# Patient Record
Sex: Female | Born: 1950 | Race: White | Hispanic: No | Marital: Married
Health system: Southern US, Community
[De-identification: ages and names within clinical notes are randomized; demographics above are authoritative.]

---

## 2013-03-26 ENCOUNTER — Ambulatory Visit: Payer: Self-pay | Admitting: Internal Medicine

## 2013-04-06 ENCOUNTER — Ambulatory Visit: Payer: Self-pay | Admitting: Internal Medicine

## 2013-04-06 HISTORY — PX: BREAST BIOPSY: SHX20

## 2013-04-09 LAB — PATHOLOGY REPORT

## 2013-05-12 ENCOUNTER — Ambulatory Visit: Payer: Self-pay | Admitting: Surgery

## 2013-05-12 HISTORY — PX: BREAST EXCISIONAL BIOPSY: SUR124

## 2013-05-13 LAB — PATHOLOGY REPORT

## 2014-07-10 NOTE — Op Note (Signed)
PATIENT NAME:  Anna Terrell, Anna Terrell MR#:  [id number] DATE OF BIRTH:  Dec 25, 1950  DATE OF PROCEDURE:  05/12/2013  PREOPERATIVE DIAGNOSIS: Atypical ductal hyperplasia of the left breast.   POSTOPERATIVE DIAGNOSIS: Atypical ductal hyperplasia of the left breast.   PROCEDURE: Excision of left breast mass.   SURGEON: Renda RollsWilton Lasalle Abee, M.D.   ANESTHESIA: General.   INDICATIONS: This 64 year old female recently had mammogram depicting a small cluster of microcalcifications. Stereotactic needle biopsy demonstrated atypical ductal hyperplasia and excision of a larger portion of tissue was recommended for further evaluation and treatment.  DESCRIPTION OF PROCEDURE: The patient was placed on the operating table in the supine position under general anesthesia. The left arm was placed on a lateral arm rest. The dressing was removed from the lateral aspect of the left breast exposing the Kopans wire, which entered the breast peripheral aspect at the 3-o'clock position. The wire was cut 2 cm from the skin. The breast was prepared with ChloraPrep and draped in a sterile manner. Mammogram images were viewed, seeing the location of the biopsy marker at the 3-o'clock position in the proximity of the Kopans wire. Next, a curvilinear incision was made from approximately 2-o'clock to 4-o'clock position of the outer aspect of the left breast and dissection was carried down to encounter the wire. Next, a portion of tissue surrounding the thick portion of the wire was removed, which was approximately 1.5 x 1.5 x 1.5 cm in dimension and this was submitted for specimen mammogram. The wound was inspected. A number of small bleeding points were cauterized during the course of the procedure. Hemostasis was subsequently intact. The tissues surrounding cautery artifact were infiltrated with 0.5% Sensorcaine with epinephrine and also subcutaneous tissues were infiltrated. The subcutaneous tissues were approximated with interrupted 4-0 chromic.    The radiologist did call back to report that the biopsy marker was seen in the specimen and tissues were sent on for routine pathology. The skin was closed with running 4-0 Monocryl subcuticular suture and Dermabond. The patient tolerated the procedure satisfactorily and was then prepared for transfer to the recovery room.    ____________________________ Shela CommonsJ. Renda RollsWilton Maui Britten, MD jws:aw D: 05/12/2013 11:25:24 ET T: 05/12/2013 11:45:01 ET JOB#: [id number]  cc: Adella HareJ. Wilton Brondon Wann, MD, <Dictator> Adella HareWILTON J Janea Schwenn MD ELECTRONICALLY SIGNED 05/13/2013 16:16

## 2014-07-20 ENCOUNTER — Other Ambulatory Visit: Payer: Self-pay | Admitting: Internal Medicine

## 2014-07-20 DIAGNOSIS — Z1231 Encounter for screening mammogram for malignant neoplasm of breast: Secondary | ICD-10-CM

## 2014-08-05 ENCOUNTER — Ambulatory Visit
Admission: RE | Admit: 2014-08-05 | Discharge: 2014-08-05 | Disposition: A | Discharge status: Home / Self Care | Payer: BC Managed Care – PPO | Source: Ambulatory Visit | Attending: Internal Medicine | Admitting: Internal Medicine

## 2014-08-05 DIAGNOSIS — R921 Mammographic calcification found on diagnostic imaging of breast: Secondary | ICD-10-CM | POA: Insufficient documentation

## 2014-08-05 DIAGNOSIS — Z1231 Encounter for screening mammogram for malignant neoplasm of breast: Secondary | ICD-10-CM | POA: Insufficient documentation

## 2015-08-10 ENCOUNTER — Other Ambulatory Visit: Payer: Self-pay | Admitting: Internal Medicine

## 2015-08-22 ENCOUNTER — Other Ambulatory Visit: Payer: Self-pay | Admitting: Internal Medicine

## 2015-08-22 DIAGNOSIS — Z1231 Encounter for screening mammogram for malignant neoplasm of breast: Secondary | ICD-10-CM

## 2015-09-06 ENCOUNTER — Ambulatory Visit
Admission: RE | Admit: 2015-09-06 | Discharge: 2015-09-06 | Disposition: A | Discharge status: Home / Self Care | Payer: Medicare Other | Source: Ambulatory Visit | Attending: Internal Medicine | Admitting: Internal Medicine

## 2015-09-06 ENCOUNTER — Other Ambulatory Visit: Payer: Self-pay | Admitting: Internal Medicine

## 2015-09-06 DIAGNOSIS — Z1231 Encounter for screening mammogram for malignant neoplasm of breast: Secondary | ICD-10-CM | POA: Diagnosis present

## 2015-11-21 IMAGING — MG MM BREAST BX W LOC DEV 1ST LESION IMAGE BX SPEC STEREO GUIDE
2 series · 3 of 3 positions shown · non-contrast
Comparison: Previous exams.

ADDENDUM:
The final pathological diagnosis is atypical ductal hyperplasia
associated with microcalcifications and columnar cell change
associated with calcifications. Due to an association of atypical
ductal hyperplasia with ductal carcinoma in situ on surgical
excision, mammographic needle localization and surgical excision of
the biopsy site in the lateral left breast is recommended. I have
not been able to reach the patient to discuss the findings and
recommendation with her at this time.

I discussed the final pathological diagnosis and recommendations
with the patient by telephone on 04/11/2013. Her questions were
answered. She reported a small amount of pain at the biopsy site
with no bruising or palpable hematoma. She will follow-up with Dr.
Guztavo.
CLINICAL DATA: Suspicious left breast calcifications.
EXAM:
LEFT STEREOTACTIC CORE NEEDLE BIOPSY

[L CC · left · 2 of 2 slices shown (1 of 2)]
[im 1/2]
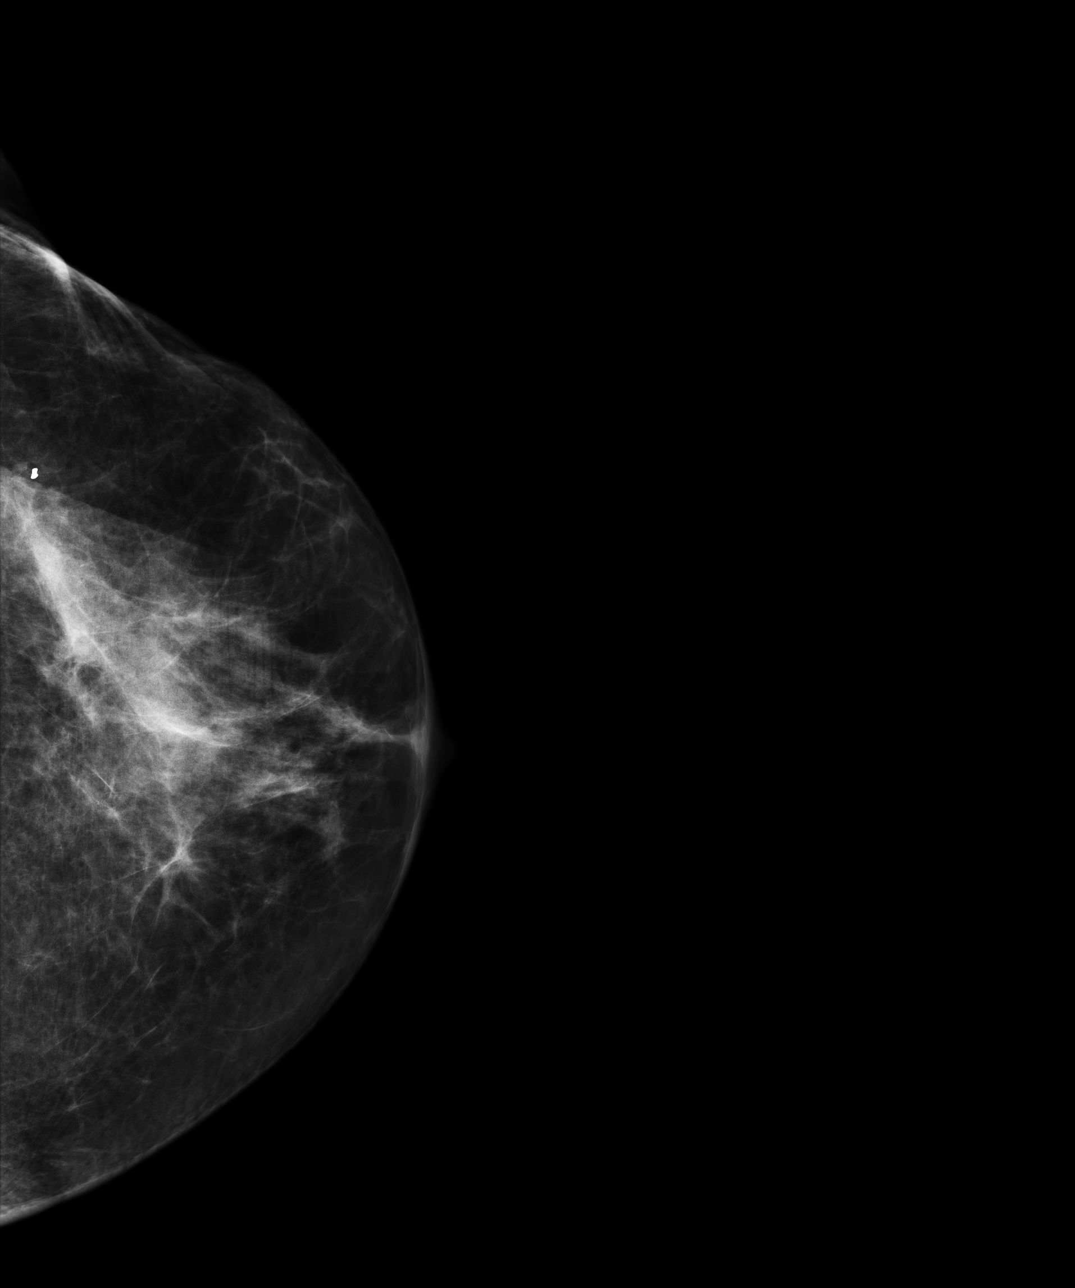
[im 2/2]
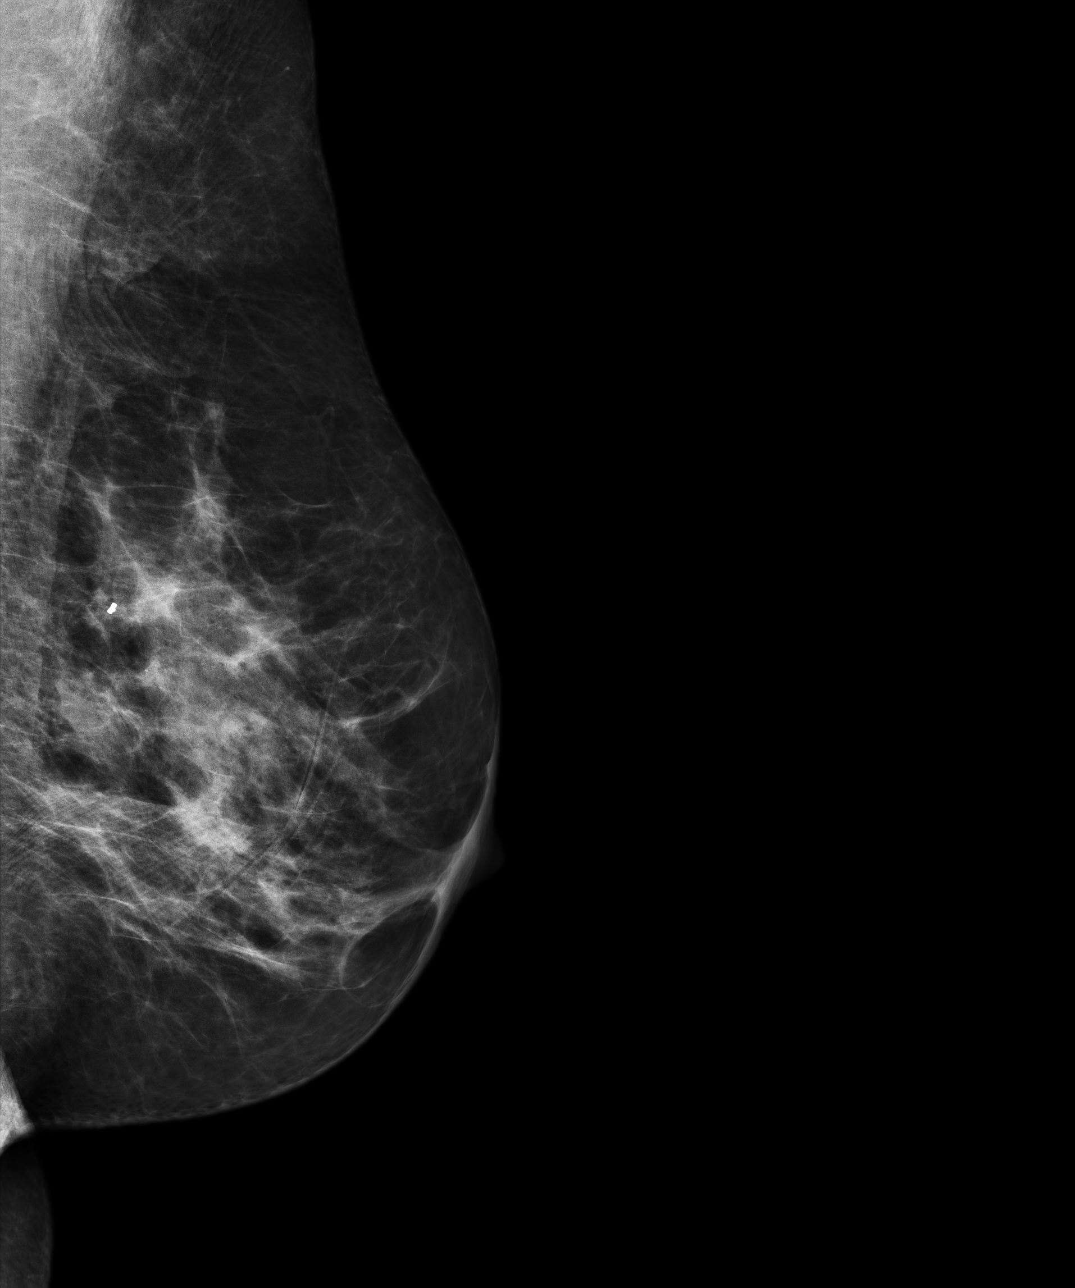

[L CC (2 of 2)]
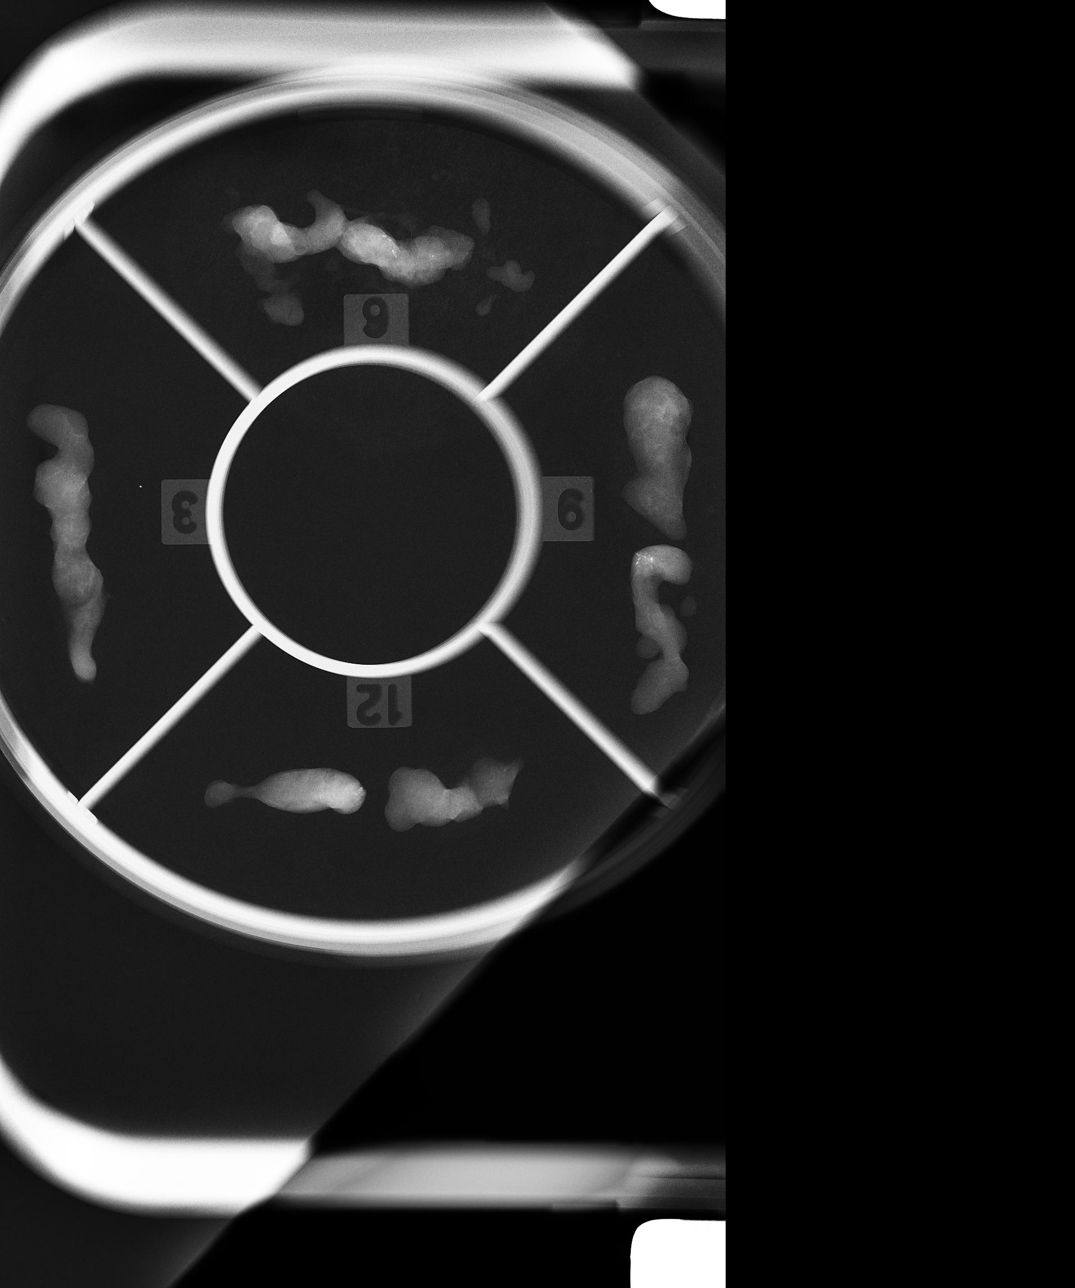

[3 of 3 positions shown; findings below may reference images not displayed]



Using sterile technique and 2% Lidocaine as local anesthetic, under
stereotactic guidance, a 9 gauge vacuum assisted device was used to
perform core needle biopsy of calcifications in the upper outer
quadrant of the left breast using a ladder approach. Specimen
radiograph was performed showing calcifications are present in the
tissue samples. Specimens with calcifications are identified for
pathology.

At the conclusion of the procedure, a top hat shaped tissue marker
clip was deployed into the biopsy cavity. Follow-up 2-view mammogram
confirmed clip placement.
IMPRESSION: Stereotactic-guided biopsy of the left breast. No apparent
complications.

## 2016-08-23 ENCOUNTER — Other Ambulatory Visit: Payer: Self-pay | Admitting: Internal Medicine

## 2016-08-23 DIAGNOSIS — Z1231 Encounter for screening mammogram for malignant neoplasm of breast: Secondary | ICD-10-CM

## 2016-09-10 ENCOUNTER — Ambulatory Visit
Admission: RE | Admit: 2016-09-10 | Discharge: 2016-09-10 | Disposition: A | Discharge status: Home / Self Care | Payer: Medicare Other | Source: Ambulatory Visit | Attending: Internal Medicine | Admitting: Internal Medicine

## 2016-09-10 DIAGNOSIS — Z1231 Encounter for screening mammogram for malignant neoplasm of breast: Secondary | ICD-10-CM | POA: Diagnosis present

## 2018-08-19 ENCOUNTER — Other Ambulatory Visit: Payer: Self-pay | Admitting: Internal Medicine

## 2018-08-19 DIAGNOSIS — Z1231 Encounter for screening mammogram for malignant neoplasm of breast: Secondary | ICD-10-CM

## 2018-09-29 ENCOUNTER — Other Ambulatory Visit: Payer: Self-pay

## 2018-09-29 ENCOUNTER — Ambulatory Visit
Admission: RE | Admit: 2018-09-29 | Discharge: 2018-09-29 | Disposition: A | Discharge status: Home / Self Care | Payer: Medicare Other | Source: Ambulatory Visit | Attending: Internal Medicine | Admitting: Internal Medicine

## 2018-09-29 DIAGNOSIS — Z1231 Encounter for screening mammogram for malignant neoplasm of breast: Secondary | ICD-10-CM | POA: Diagnosis not present

## 2019-08-25 ENCOUNTER — Other Ambulatory Visit: Payer: Self-pay | Admitting: Internal Medicine

## 2019-08-25 DIAGNOSIS — Z1231 Encounter for screening mammogram for malignant neoplasm of breast: Secondary | ICD-10-CM

## 2019-09-30 ENCOUNTER — Ambulatory Visit
Admission: RE | Admit: 2019-09-30 | Discharge: 2019-09-30 | Disposition: A | Discharge status: Home / Self Care | Payer: Medicare PPO | Source: Ambulatory Visit | Attending: Internal Medicine | Admitting: Internal Medicine

## 2019-09-30 DIAGNOSIS — Z1231 Encounter for screening mammogram for malignant neoplasm of breast: Secondary | ICD-10-CM | POA: Insufficient documentation

## 2020-10-17 ENCOUNTER — Other Ambulatory Visit: Payer: Self-pay | Admitting: Internal Medicine

## 2020-10-17 DIAGNOSIS — Z1231 Encounter for screening mammogram for malignant neoplasm of breast: Secondary | ICD-10-CM

## 2020-11-08 ENCOUNTER — Ambulatory Visit
Admission: RE | Admit: 2020-11-08 | Discharge: 2020-11-08 | Disposition: A | Discharge status: Home / Self Care | Payer: Medicare PPO | Source: Ambulatory Visit | Attending: Internal Medicine | Admitting: Internal Medicine

## 2020-11-08 ENCOUNTER — Other Ambulatory Visit: Payer: Self-pay

## 2020-11-08 DIAGNOSIS — Z1231 Encounter for screening mammogram for malignant neoplasm of breast: Secondary | ICD-10-CM | POA: Diagnosis not present

## 2021-08-25 ENCOUNTER — Other Ambulatory Visit: Payer: Self-pay | Admitting: Internal Medicine

## 2021-08-25 DIAGNOSIS — Z1231 Encounter for screening mammogram for malignant neoplasm of breast: Secondary | ICD-10-CM

## 2021-11-14 ENCOUNTER — Ambulatory Visit
Admission: RE | Admit: 2021-11-14 | Discharge: 2021-11-14 | Disposition: A | Discharge status: Home / Self Care | Payer: Medicare PPO | Source: Ambulatory Visit | Attending: Internal Medicine | Admitting: Internal Medicine

## 2021-11-14 DIAGNOSIS — Z1231 Encounter for screening mammogram for malignant neoplasm of breast: Secondary | ICD-10-CM | POA: Diagnosis present

## 2022-08-27 ENCOUNTER — Other Ambulatory Visit: Payer: Self-pay

## 2022-08-27 DIAGNOSIS — Z1231 Encounter for screening mammogram for malignant neoplasm of breast: Secondary | ICD-10-CM

## 2022-10-02 LAB — COLOGUARD: COLOGUARD: NEGATIVE

## 2022-11-20 ENCOUNTER — Ambulatory Visit
Admission: RE | Admit: 2022-11-20 | Discharge: 2022-11-20 | Disposition: A | Discharge status: Home / Self Care | Payer: Medicare PPO | Source: Ambulatory Visit | Attending: Internal Medicine | Admitting: Internal Medicine

## 2022-11-20 DIAGNOSIS — Z1231 Encounter for screening mammogram for malignant neoplasm of breast: Secondary | ICD-10-CM | POA: Insufficient documentation

## 2023-06-25 IMAGING — MG MM DIGITAL SCREENING BILAT W/ TOMO AND CAD
6 of 10 series · 6 of 30 positions shown · non-contrast
Comparison: Previous exam(s).

CLINICAL DATA: Screening.

EXAM:
DIGITAL SCREENING BILATERAL MAMMOGRAM WITH TOMOSYNTHESIS AND CAD
TECHNIQUE: Bilateral screening digital craniocaudal and mediolateral oblique
mammograms were obtained. Bilateral screening digital breast
tomosynthesis was performed. The images were evaluated with
computer-aided detection.

[R MLO synth-2D (1 of 2)]
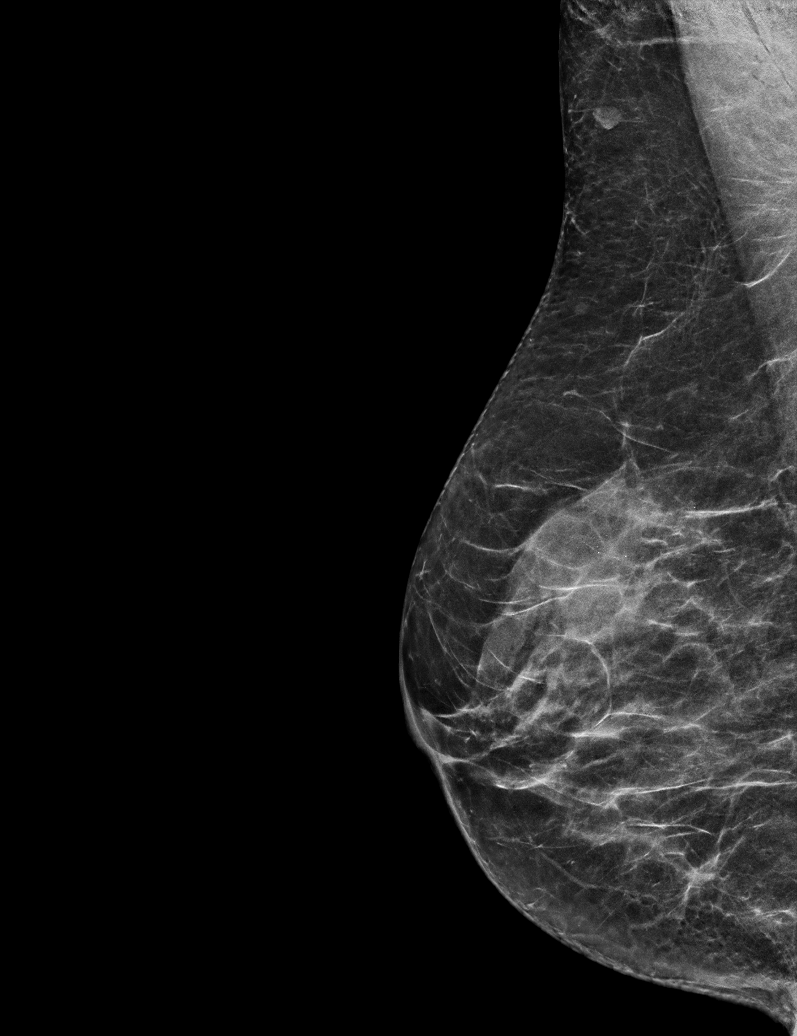

[L MLO synth-2D]
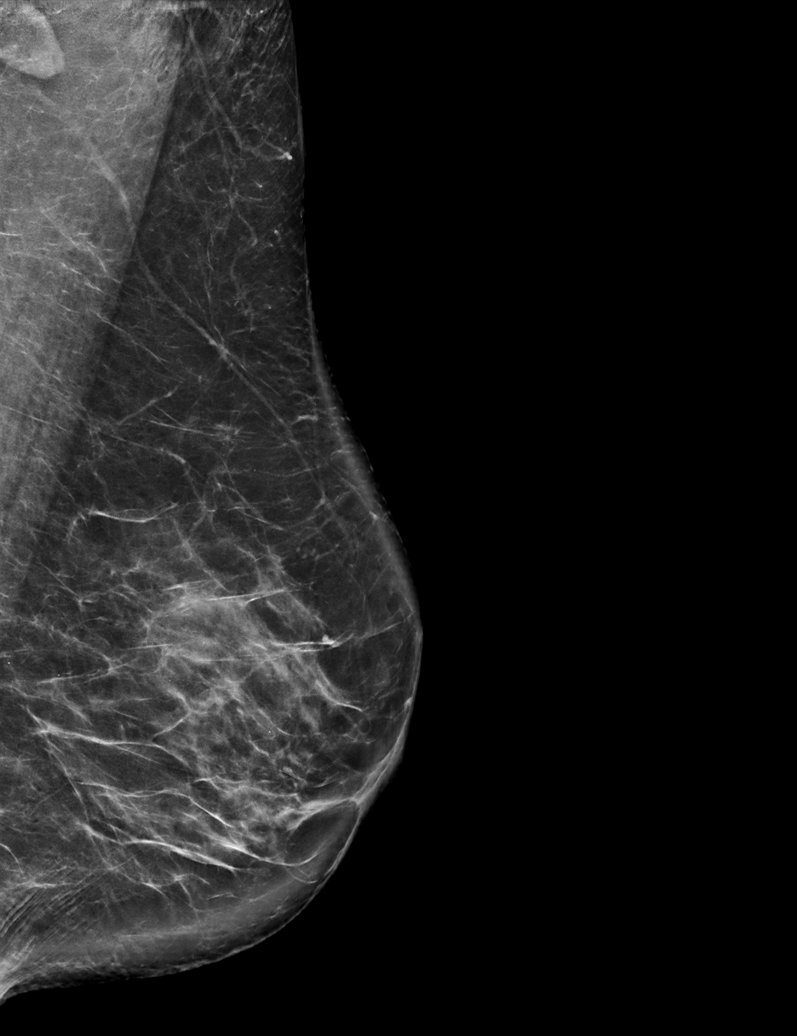

[L CC synth-2D]
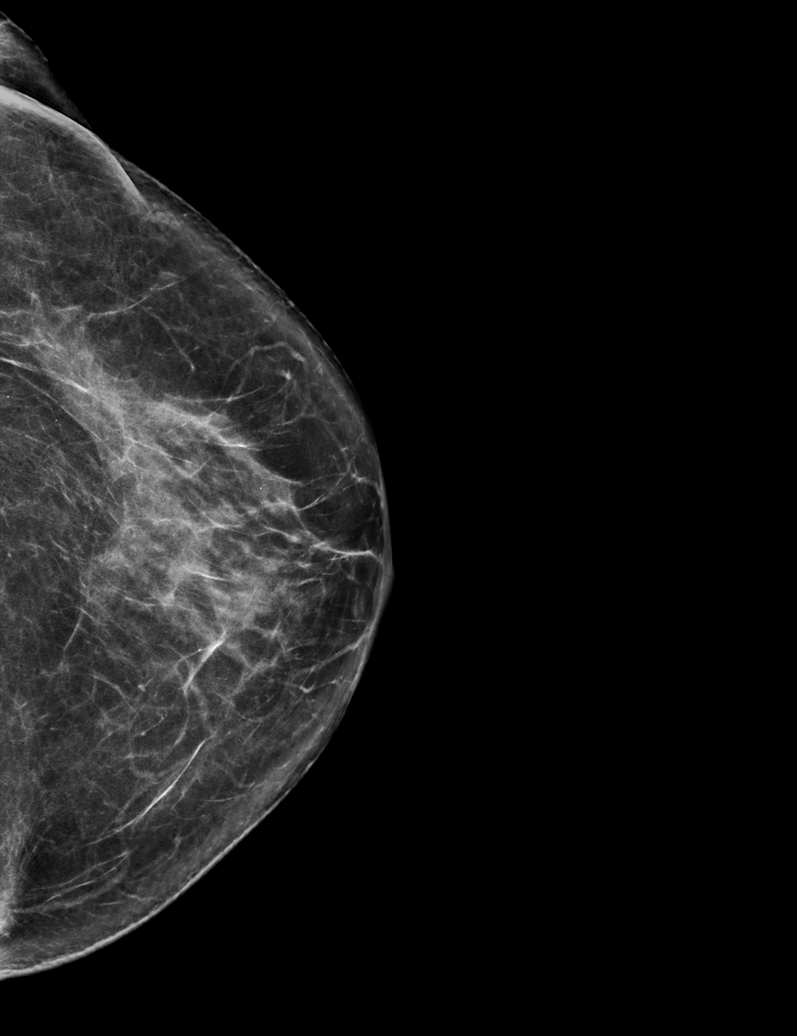

[R MLO synth-2D (2 of 2)]
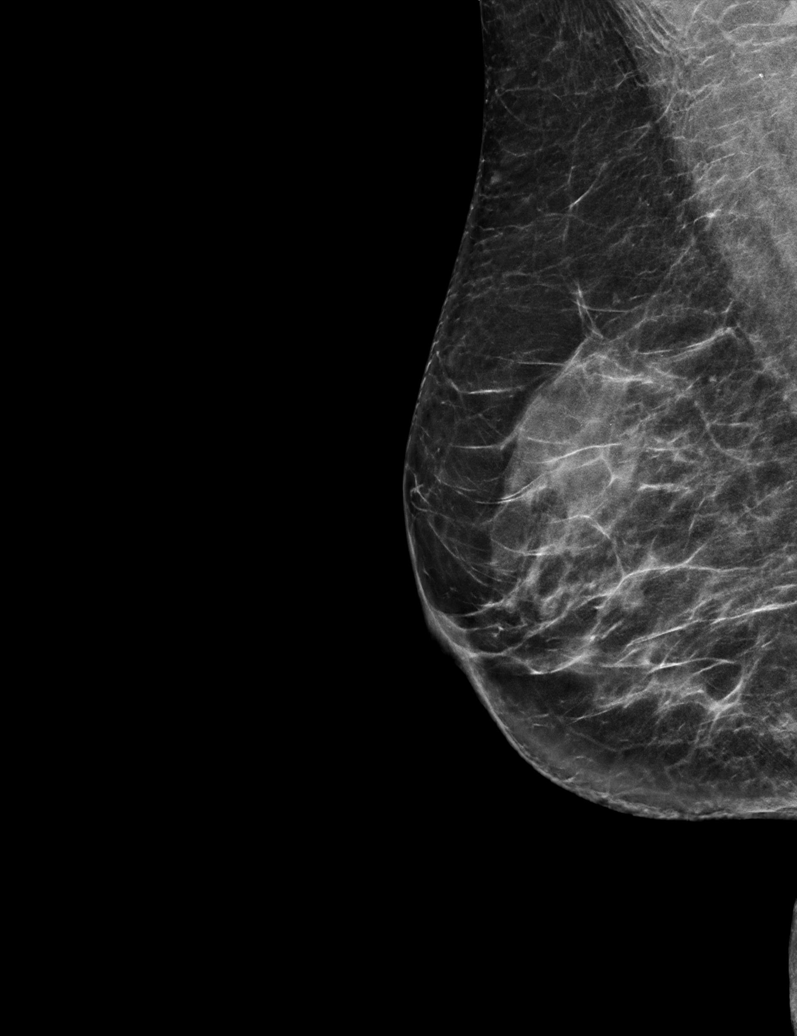

[R CC synth-2D]
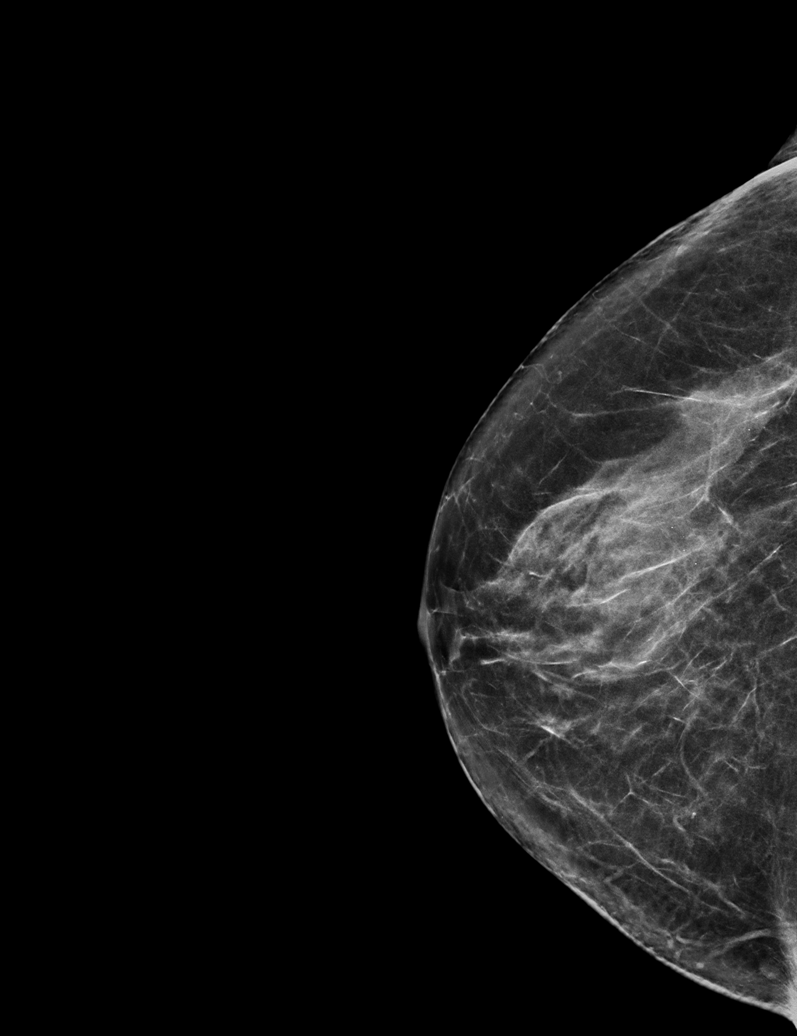

[L MLO tomo · tomo slice 46/91.0]
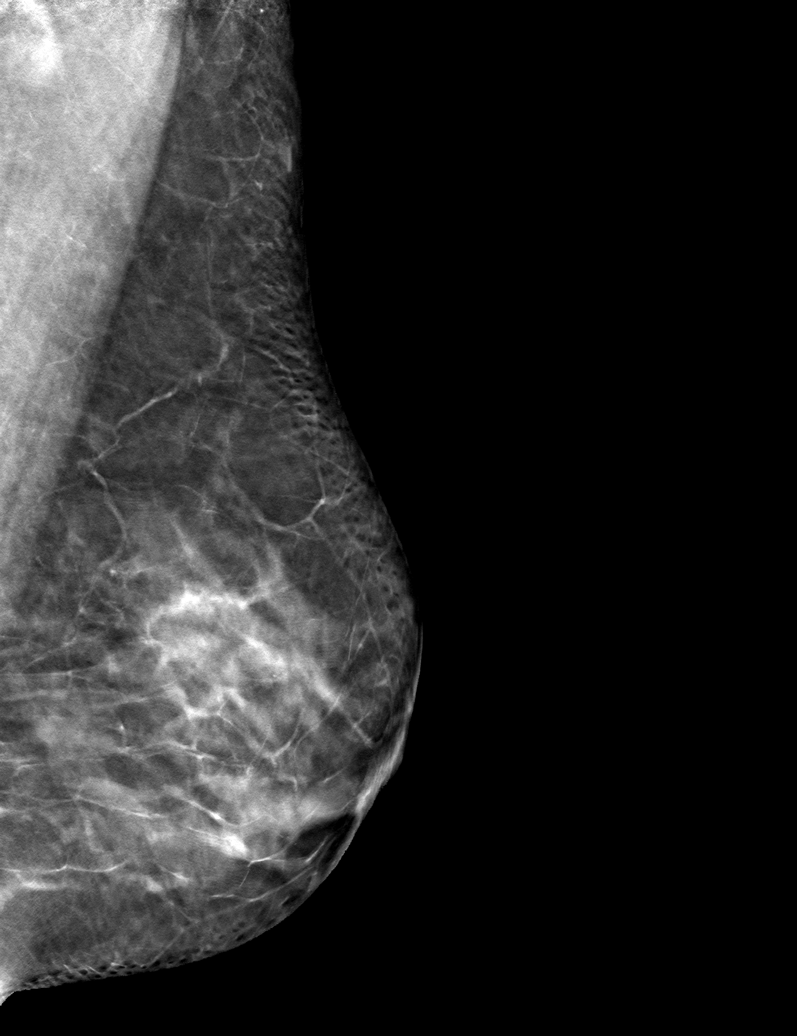

[6 of 30 positions shown; findings below may reference images not displayed]

ACR Breast Density Category c: The breast tissue is heterogeneously
dense, which may obscure small masses.
FINDINGS: There are no findings suspicious for malignancy.
IMPRESSION: No mammographic evidence of malignancy. A result letter of this
screening mammogram will be mailed directly to the patient.

RECOMMENDATION:
Screening mammogram in one year. (Code:Q3-W-BC3)

BI-RADS CATEGORY  1: Negative.

## 2023-10-14 ENCOUNTER — Other Ambulatory Visit: Payer: Self-pay | Admitting: Internal Medicine

## 2023-10-14 DIAGNOSIS — Z1231 Encounter for screening mammogram for malignant neoplasm of breast: Secondary | ICD-10-CM

## 2023-11-22 ENCOUNTER — Ambulatory Visit
Admission: RE | Admit: 2023-11-22 | Discharge: 2023-11-22 | Disposition: A | Discharge status: Home / Self Care | Source: Ambulatory Visit | Attending: Internal Medicine | Admitting: Internal Medicine

## 2023-11-22 DIAGNOSIS — Z1231 Encounter for screening mammogram for malignant neoplasm of breast: Secondary | ICD-10-CM | POA: Diagnosis present
